# Patient Record
Sex: Male | Born: 1965 | Race: Black or African American | Hispanic: No | Marital: Married
Health system: Southern US, Community
[De-identification: ages and names within clinical notes are randomized; demographics above are authoritative.]

---

## 2008-07-14 ENCOUNTER — Emergency Department: Payer: Self-pay | Admitting: Emergency Medicine

## 2008-07-15 ENCOUNTER — Emergency Department: Payer: Self-pay

## 2008-07-26 ENCOUNTER — Ambulatory Visit: Payer: Self-pay | Admitting: General Surgery

## 2008-08-02 ENCOUNTER — Ambulatory Visit: Payer: Self-pay | Admitting: General Surgery

## 2008-08-03 ENCOUNTER — Ambulatory Visit: Payer: Self-pay | Admitting: General Surgery

## 2011-03-01 ENCOUNTER — Emergency Department: Payer: Self-pay | Admitting: Unknown Physician Specialty

## 2012-04-08 ENCOUNTER — Emergency Department: Payer: Self-pay | Admitting: Emergency Medicine

## 2012-04-08 LAB — BASIC METABOLIC PANEL
Anion Gap: 6 — ABNORMAL LOW (ref 7–16)
BUN: 15 mg/dL (ref 7–18)
Calcium, Total: 8.8 mg/dL (ref 8.5–10.1)
Co2: 31 mmol/L (ref 21–32)
EGFR (Non-African Amer.): >60
Glucose: 121 mg/dL — ABNORMAL HIGH (ref 65–99)
Osmolality: 285 (ref 275–301)
Potassium: 4 mmol/L (ref 3.5–5.1)

## 2012-04-08 LAB — TSH: Thyroid Stimulating Horm: 4.94 u[IU]/mL — ABNORMAL HIGH

## 2012-04-08 LAB — CK TOTAL AND CKMB (NOT AT ARMC)
CK, Total: 140 U/L (ref 35–232)
CK, Total: 135 U/L (ref 35–232)
CK-MB: <0.5 ng/mL — ABNORMAL LOW (ref 0.5–3.6)
CK-MB: 0.6 ng/mL (ref 0.5–3.6)

## 2012-04-08 LAB — CBC
HCT: 43.2 % (ref 40.0–52.0)
Platelet: 237 10*3/uL (ref 150–440)
RBC: 4.7 10*6/uL (ref 4.40–5.90)
RDW: 13.8 % (ref 11.5–14.5)
WBC: 6.7 10*3/uL (ref 3.8–10.6)

## 2013-12-25 ENCOUNTER — Emergency Department: Payer: Self-pay | Admitting: Emergency Medicine

## 2013-12-27 ENCOUNTER — Observation Stay: Payer: Self-pay | Admitting: Specialist

## 2013-12-27 LAB — BASIC METABOLIC PANEL
Anion Gap: 6 — ABNORMAL LOW (ref 7–16)
BUN: 21 mg/dL — AB (ref 7–18)
CHLORIDE: 98 mmol/L (ref 98–107)
CO2: 28 mmol/L (ref 21–32)
CREATININE: 1.17 mg/dL (ref 0.60–1.30)
Calcium, Total: 9.1 mg/dL (ref 8.5–10.1)
EGFR (African American): >60
GLUCOSE: 95 mg/dL (ref 65–99)
OSMOLALITY: 267 (ref 275–301)
Potassium: 4 mmol/L (ref 3.5–5.1)
Sodium: 132 mmol/L — ABNORMAL LOW (ref 136–145)

## 2013-12-27 LAB — CBC WITH DIFFERENTIAL/PLATELET
BASOS ABS: 0.1 10*3/uL (ref 0.0–0.1)
BASOS PCT: 0.6 %
EOS ABS: 0.2 10*3/uL (ref 0.0–0.7)
Eosinophil %: 2 %
HCT: 48 % (ref 40.0–52.0)
HGB: 16 g/dL (ref 13.0–18.0)
LYMPHS ABS: 2 10*3/uL (ref 1.0–3.6)
LYMPHS PCT: 20.3 %
MCH: 30.2 pg (ref 26.0–34.0)
MCHC: 33.3 g/dL (ref 32.0–36.0)
MCV: 91 fL (ref 80–100)
MONO ABS: 1.2 x10 3/mm — AB (ref 0.2–1.0)
MONOS PCT: 11.8 %
Neutrophil #: 6.6 10*3/uL — ABNORMAL HIGH (ref 1.4–6.5)
Neutrophil %: 65.3 %
Platelet: 237 10*3/uL (ref 150–440)
RBC: 5.29 10*6/uL (ref 4.40–5.90)
RDW: 14 % (ref 11.5–14.5)
WBC: 10.1 10*3/uL (ref 3.8–10.6)

## 2013-12-28 LAB — BASIC METABOLIC PANEL
Anion Gap: 7 (ref 7–16)
BUN: 15 mg/dL (ref 7–18)
Calcium, Total: 8.8 mg/dL (ref 8.5–10.1)
Chloride: 100 mmol/L (ref 98–107)
Co2: 29 mmol/L (ref 21–32)
Creatinine: 0.99 mg/dL (ref 0.60–1.30)
EGFR (African American): >60
EGFR (Non-African Amer.): >60
Glucose: 105 mg/dL — ABNORMAL HIGH (ref 65–99)
Osmolality: 273 (ref 275–301)
Potassium: 3.8 mmol/L (ref 3.5–5.1)
Sodium: 136 mmol/L (ref 136–145)

## 2013-12-28 LAB — CBC WITH DIFFERENTIAL/PLATELET
Basophil #: 0 10*3/uL (ref 0.0–0.1)
Basophil %: 0.5 %
EOS ABS: 0.2 10*3/uL (ref 0.0–0.7)
EOS PCT: 3.1 %
HCT: 43.4 % (ref 40.0–52.0)
HGB: 14.4 g/dL (ref 13.0–18.0)
Lymphocyte #: 2.5 10*3/uL (ref 1.0–3.6)
Lymphocyte %: 33.3 %
MCH: 30 pg (ref 26.0–34.0)
MCHC: 33.3 g/dL (ref 32.0–36.0)
MCV: 90 fL (ref 80–100)
Monocyte #: 1.1 x10 3/mm — ABNORMAL HIGH (ref 0.2–1.0)
Monocyte %: 15 %
Neutrophil #: 3.6 10*3/uL (ref 1.4–6.5)
Neutrophil %: 48.1 %
PLATELETS: 260 10*3/uL (ref 150–440)
RBC: 4.82 10*6/uL (ref 4.40–5.90)
RDW: 13.9 % (ref 11.5–14.5)
WBC: 7.4 10*3/uL (ref 3.8–10.6)

## 2014-01-01 LAB — CULTURE, BLOOD (SINGLE)

## 2014-01-16 ENCOUNTER — Ambulatory Visit: Payer: Self-pay

## 2014-06-06 IMAGING — CR DG CHEST 2V
1 series · 2 of 2 positions shown · non-contrast
Comparison: None.

CLINICAL DATA: Bi cough.  Decreased breath sounds in lung bases.

EXAM:
CHEST  2 VIEW

[Series 1: w chest pa · 0.14mm/px · 2 of 2 slices shown]
[im 1/2]
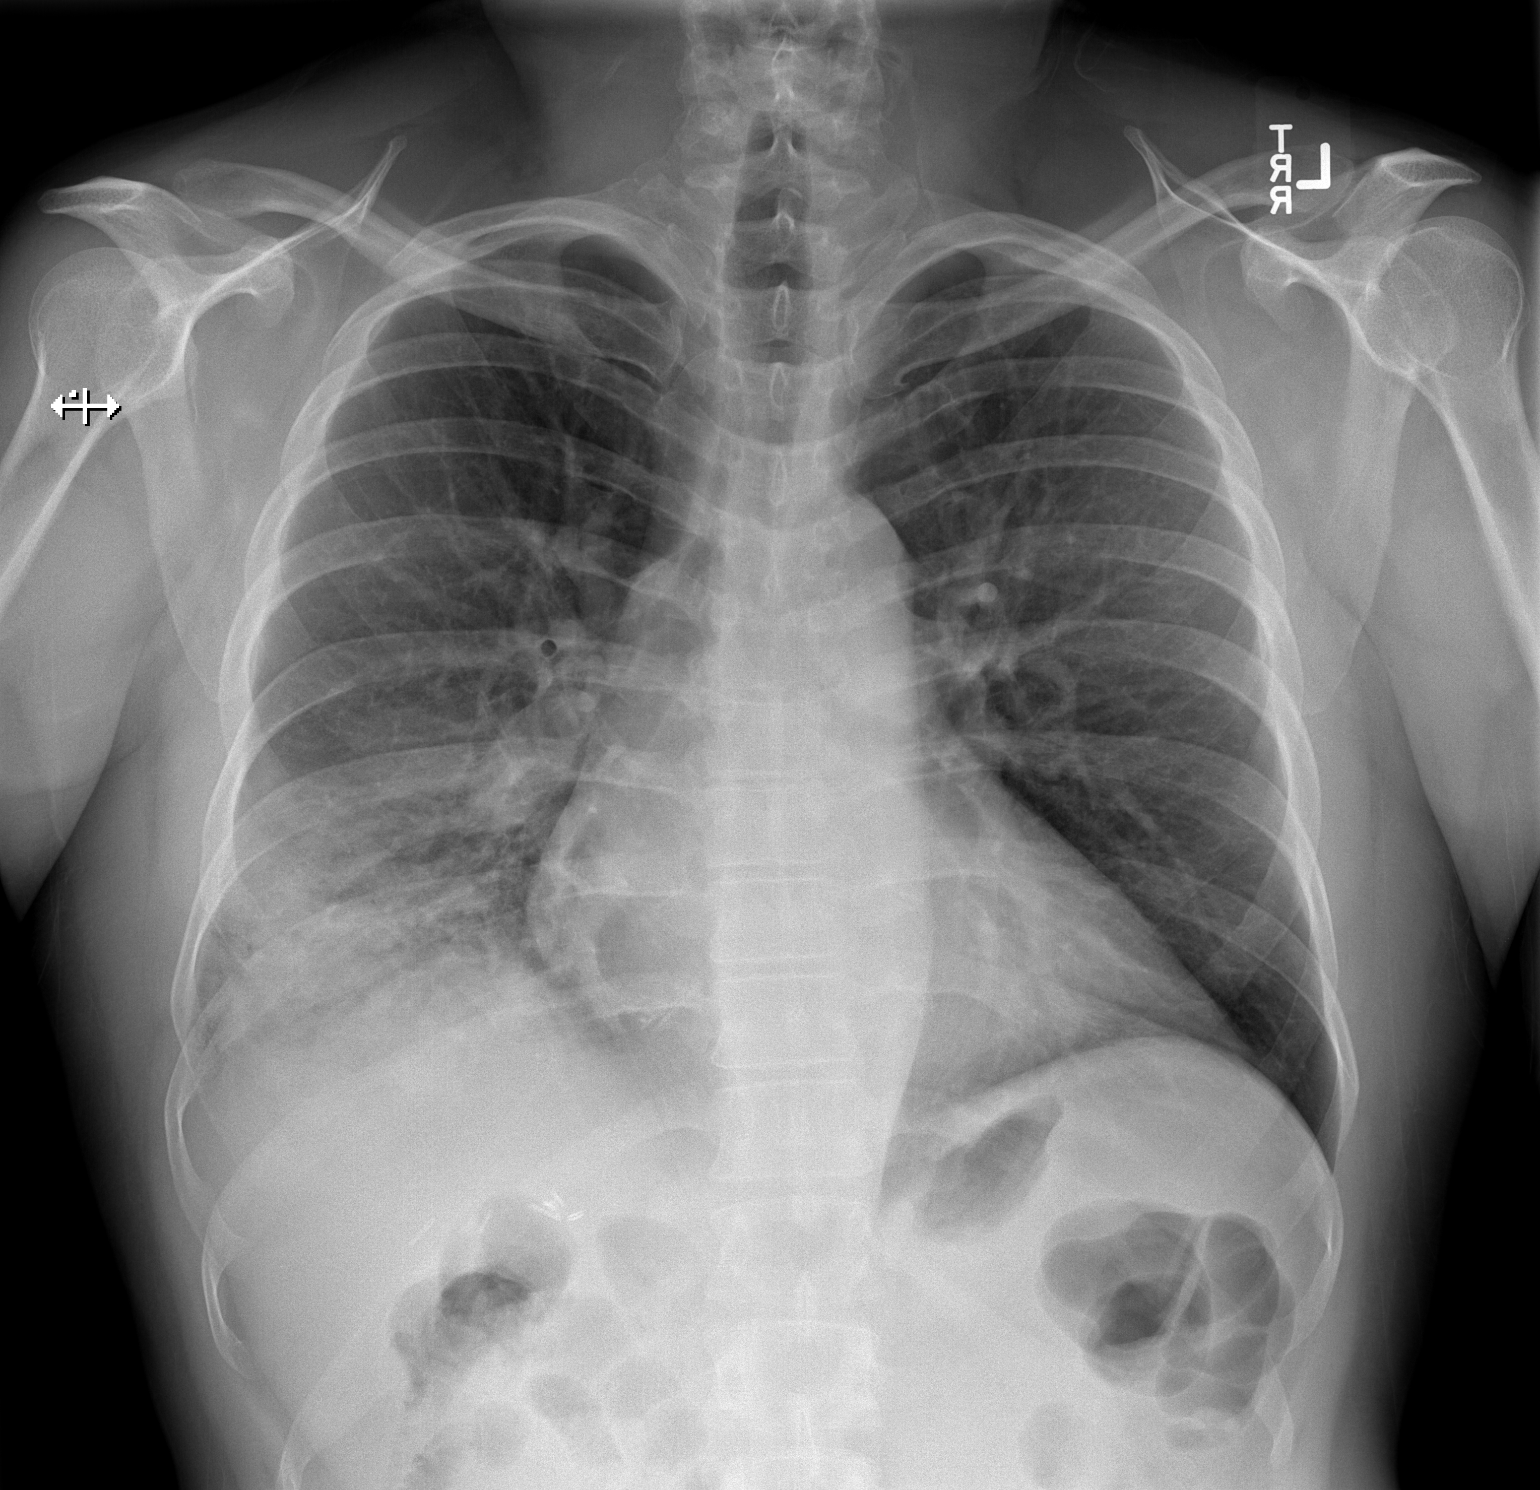
[im 2/2]
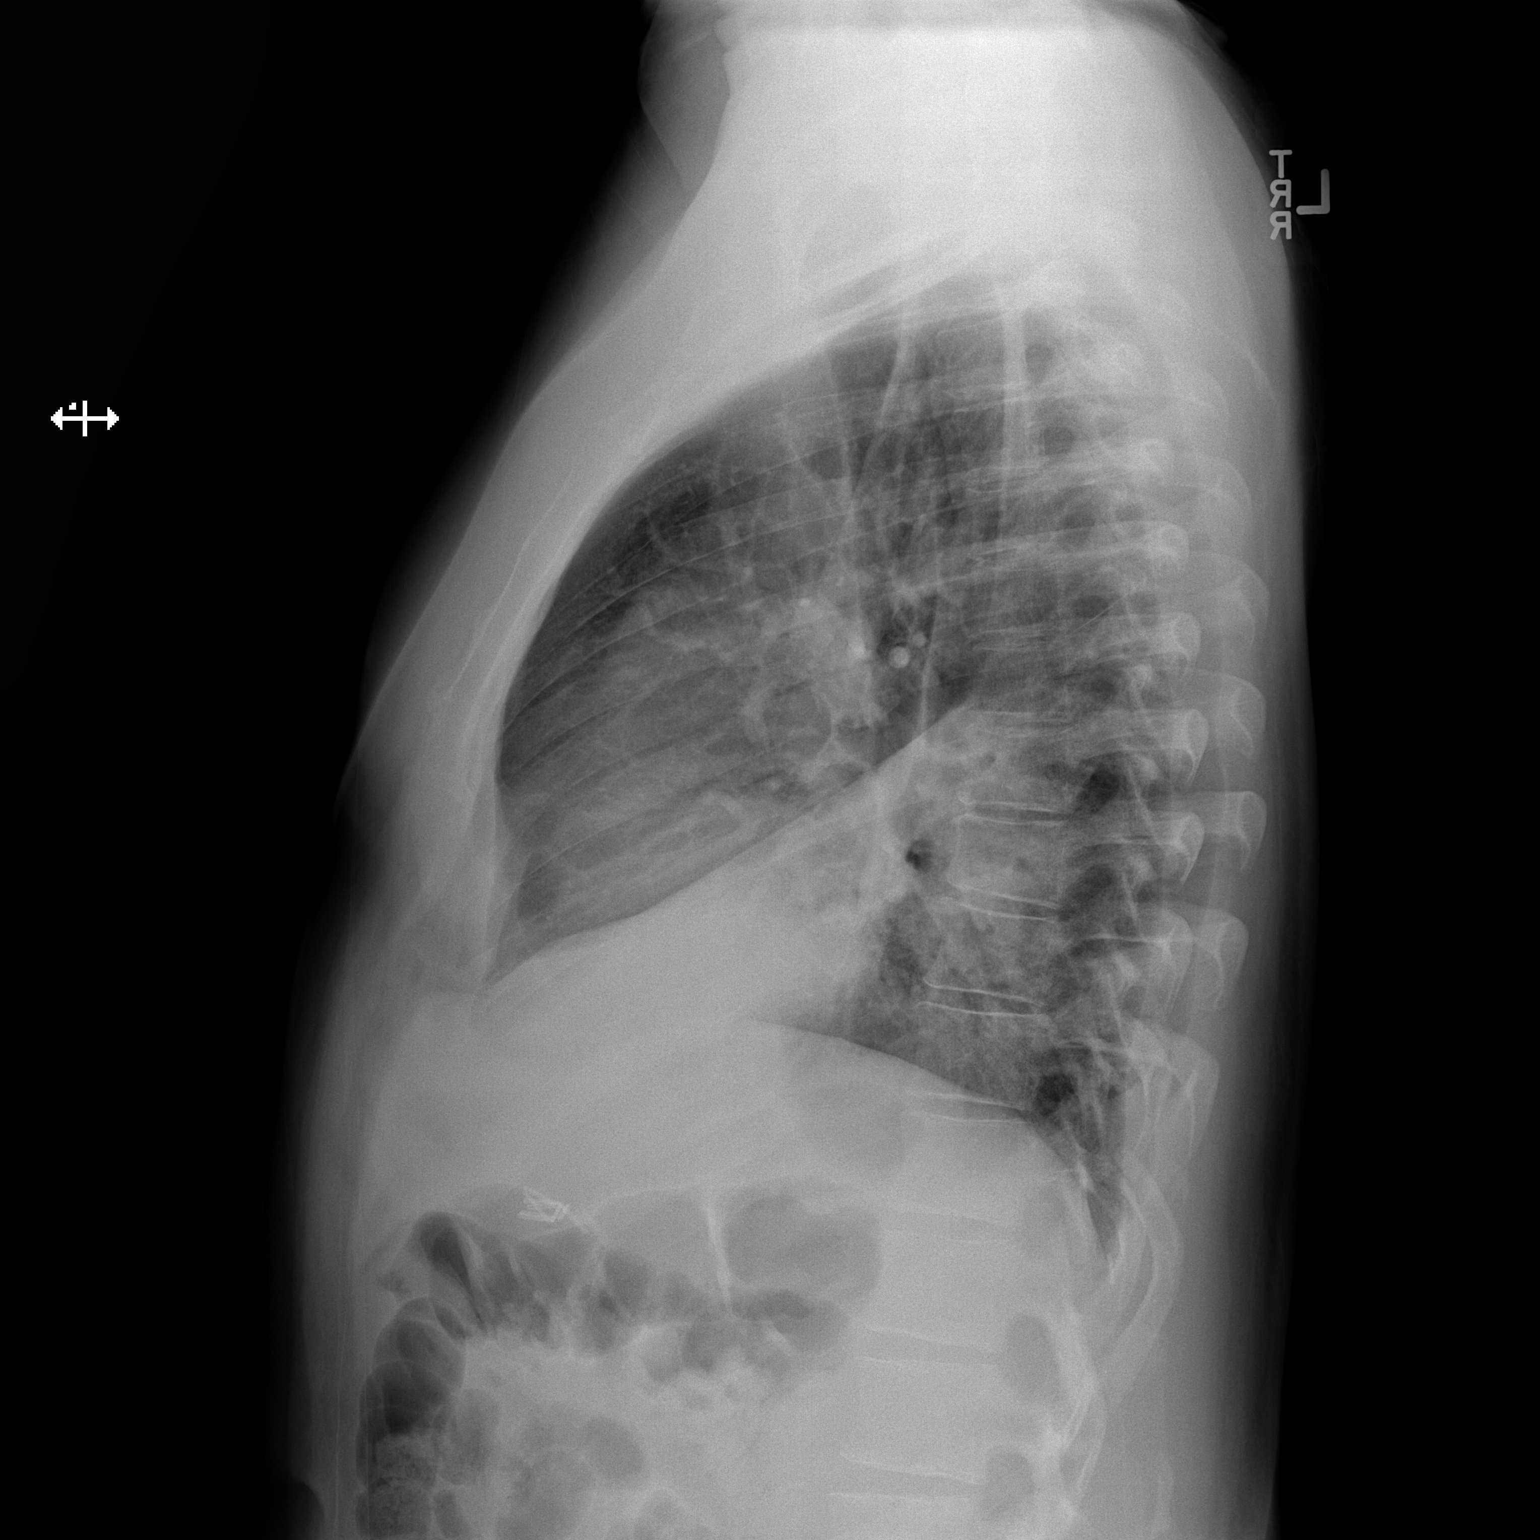

[2 of 2 positions shown; findings below may reference images not displayed]

FINDINGS: Airspace disease is seen involving a significant portion of the
right lower lobe, consistent with pneumonia. Left lung is clear. No
evidence of pleural effusion. No definite mass or lymphadenopathy
identified. Heart size is normal.
IMPRESSION: Right lower lobe airspace disease, consistent with pneumonia.
Recommend chest radiographic followup in several weeks to confirm
resolution.

## 2014-06-28 IMAGING — CR DG CHEST 2V
1 series · 3 of 3 positions shown · non-contrast
Comparison: 12/25/2013

CLINICAL DATA: Follow-up pneumonia

EXAM:
CHEST  2 VIEW

[Series 1: w chest pa · 0.14mm/px · 3 of 3 slices shown]
[im 1/3]
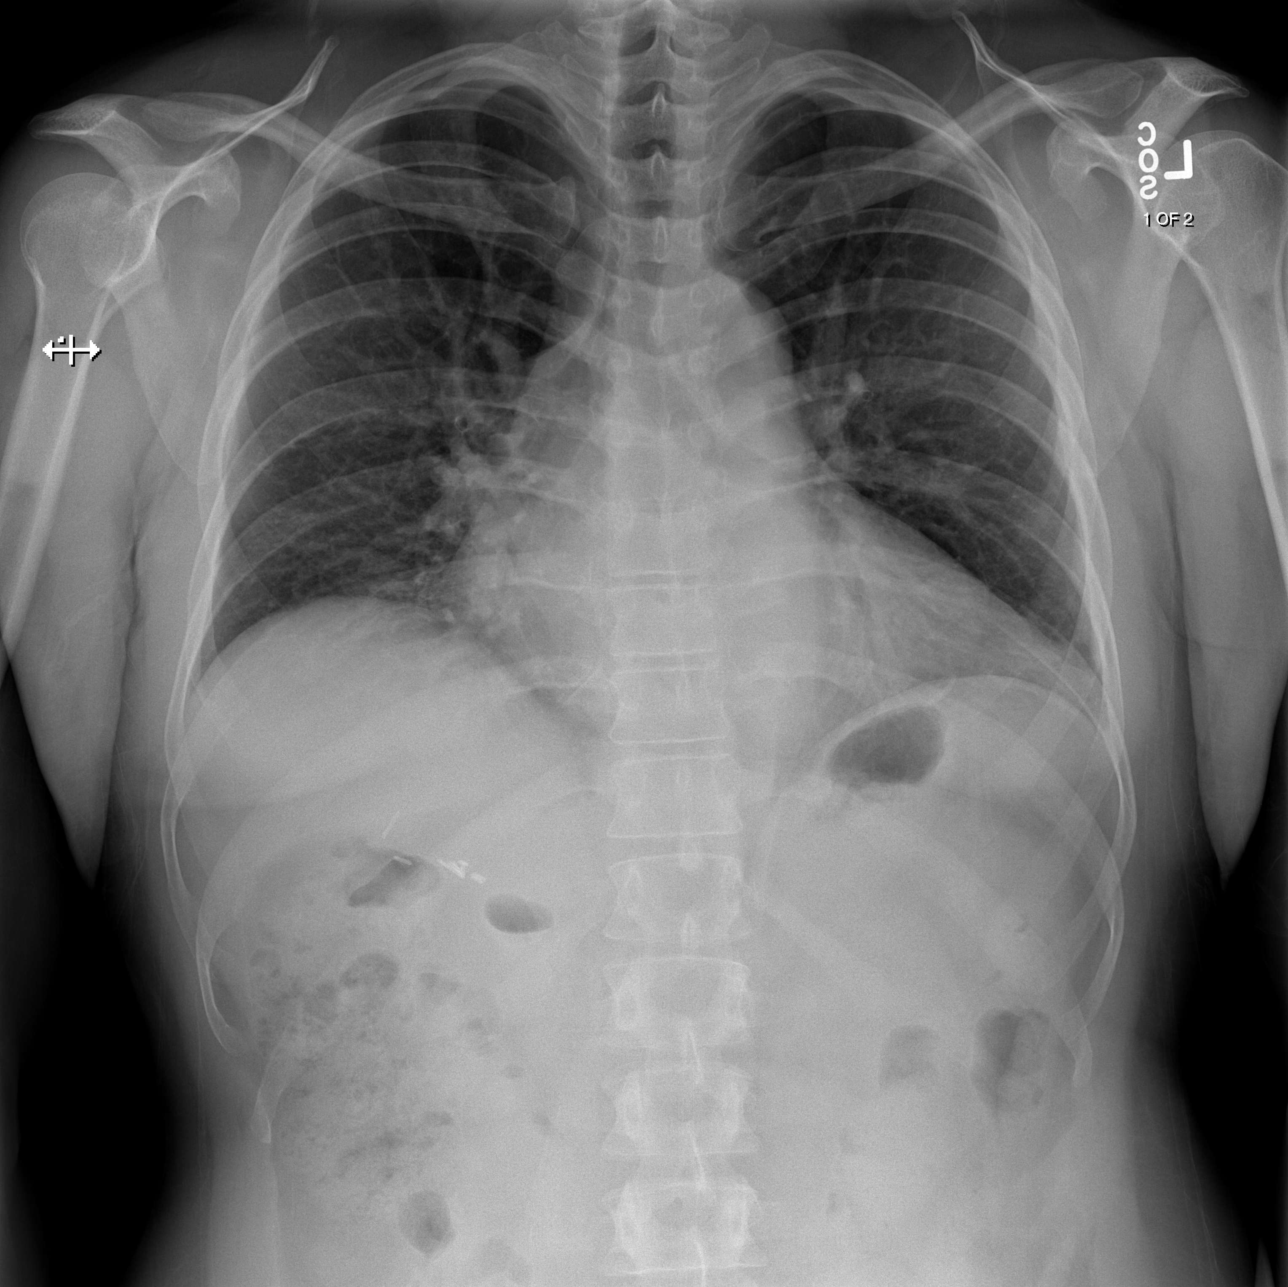
[im 2/3]
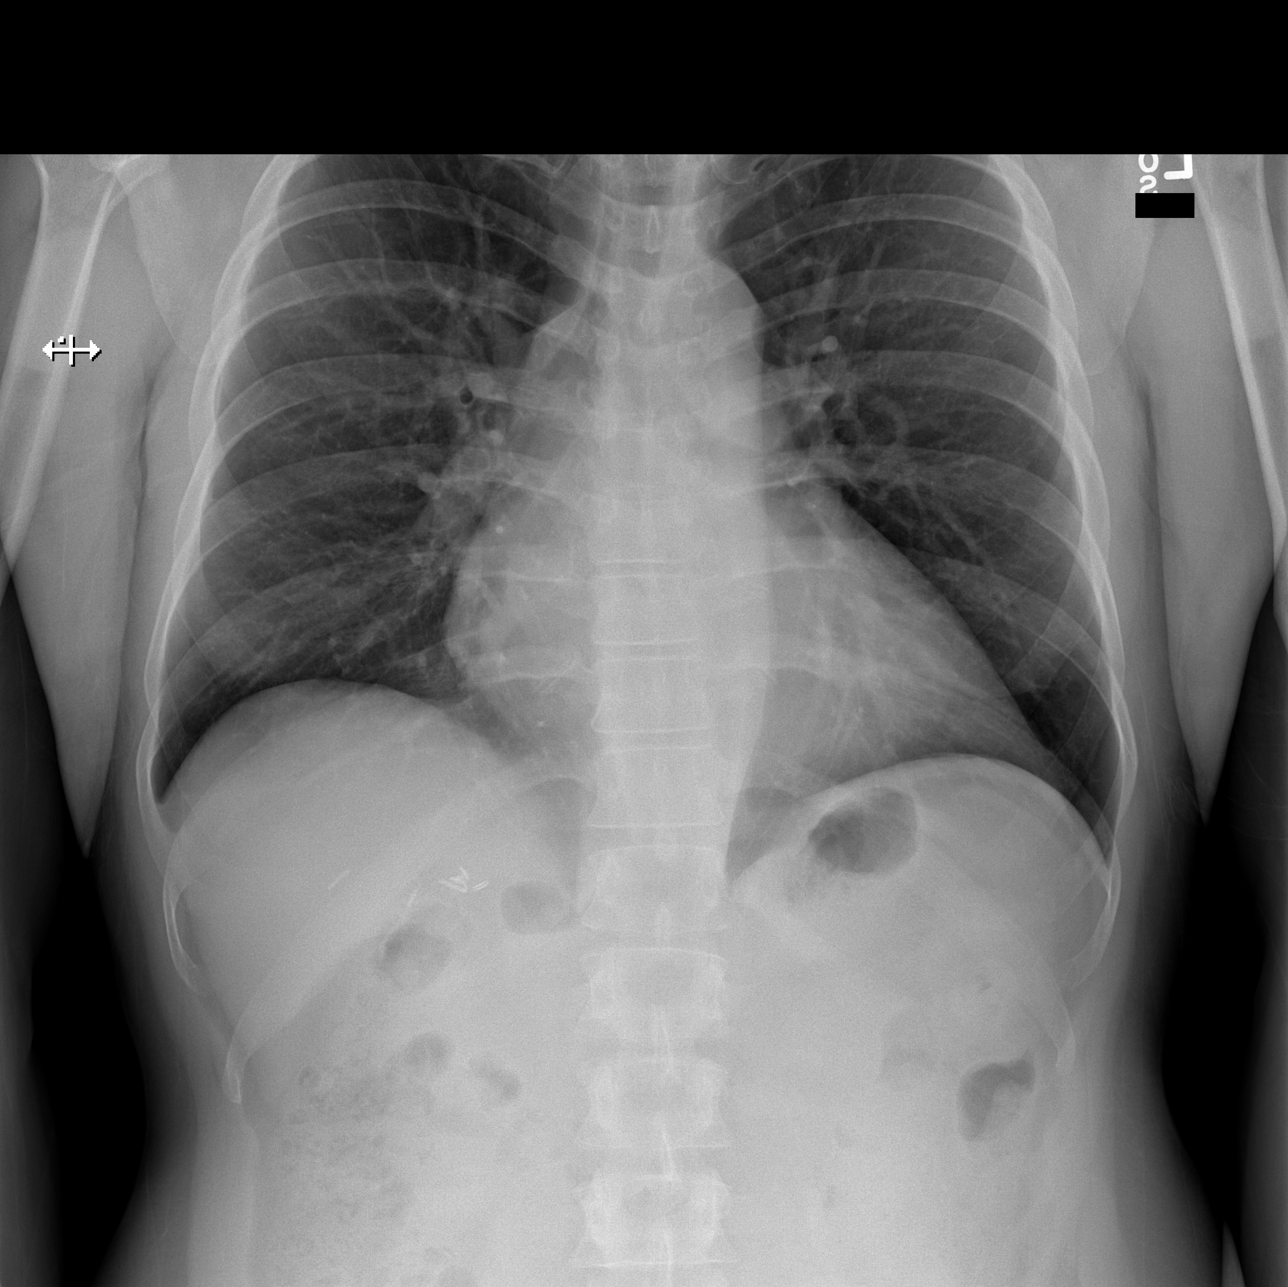
[im 3/3]
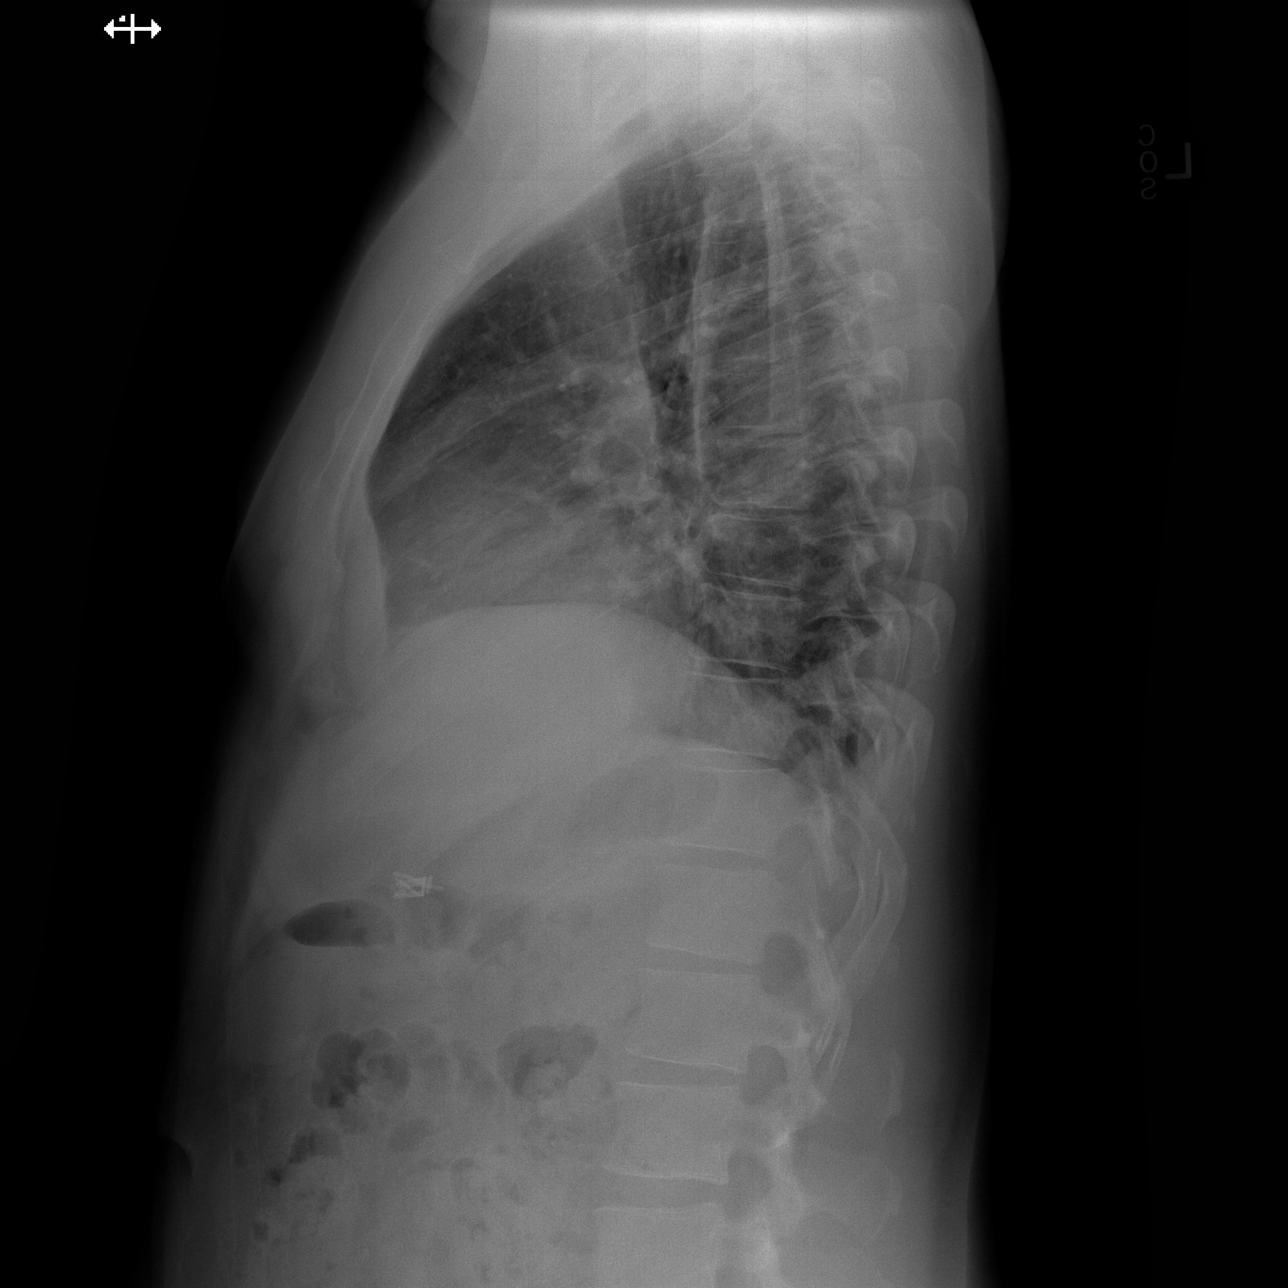

[3 of 3 positions shown; findings below may reference images not displayed]

FINDINGS: Lungs are clear. Prior right lower lobe opacity/pneumonia has
resolved. No pleural effusion or pneumothorax.

The heart is normal in size.

Visualized osseous structures are within normal limits.

Cholecystectomy clips.
IMPRESSION: No evidence of acute cardiopulmonary disease.

## 2014-12-11 NOTE — H&P (Signed)
PATIENT NAME:  Lance Young, Masyn D MR#:  [id number] DATE OF BIRTH:  1966/07/19  DATE OF ADMISSION:  12/27/2013  [place]   ED REFERRING DOCTOR:  Dr. Lenard LancePaduchowski  CHIEF COMPLAINT: Cough, shortness of breath.   HISTORY OF PRESENT ILLNESS: The patient is a 49 year old, African-American male with a history of hypertension and sleep apnea, who states that he started feeling bad last week. With cough and shortness of breath, he was seen at [facility] and told that he had flu-like symptoms, and was started on Tamiflu. He continued to have cough and shortness of breath, so he came back to the ED here on May 8, was noted to have right lower lobe airspace disease consistent with pneumonia. He was discharged on azithromycin; however, he continued to complain of cough and congestion. Therefore, he came back to the ER. We are asked to admit the patient. He reports that he is continuing to have fever and a temperature of 102. He has been coughing, but it is a nonproductive cough. He feels that he has congestion, but it is not coming out. He also has body aches and chills. Denies any chest pain or palpitations. Denies any nausea, vomiting or diarrhea. Has also been having intermittent wheezing. Denies any abdominal pain.   PAST MEDICAL HISTORY: History of hypertension, sleep apnea, supposed to be on CPAP but there is something wrong with his tubing, so he is getting plan to change it out.   PAST SURGICAL HISTORY: Status post cholecystectomy, status post splenectomy.   ALLERGIES: None.   MEDICATIONS: Propranolol 60, 1 tab p.o. b.i.d., melatonin as needed, azithromycin 5 day course.   SOCIAL HISTORY: Does not smoke. Does not drink. No drugs.   FAMILY HISTORY: Positive for hypertension on his Dad's side.   REVIEW OF SYSTEMS:   CONSTITUTIONAL: Complains of fevers. No pain. No weight loss. No weight gain.  EYES: No blurred or double vision. No pain. No redness. No inflammation. No  glaucoma.  ENT: No tinnitus. No ear pain. No hearing loss. No seasonal or year-round allergies. No epistaxis. Complains of coughing. Denies any difficulty swallowing.  RESPIRATORY: Complains of coughing. No wheezing. No hemoptysis. No COPD.  CARDIOVASCULAR: Denies any chest pain, orthopnea, edema, or arrhythmia.  GASTROINTESTINAL: No nausea, vomiting, diarrhea. No abdominal pain. No hematemesis. No melena.  GENITOURINARY: Denies any dysuria, hematuria, renal calculus or frequency.  ENDOCRINE: Denies any polyuria, nocturia or thyroid problems.  HEMATOLOGIC/LYMPHATIC: Denies anemia, easy bruisability or bleeding.  SKIN: No acne. No rash. No changes in mole, hair or skin.  MUSCULOSKELETAL: No pain in neck, back or shoulder.  NEUROLOGIC: No numbness, CVA, TIA, or seizures.  PSYCHIATRIC: No anxiety, insomnia, or ADD.   PHYSICAL EXAMINATION: VITAL SIGNS: Temperature 99.8, pulse 85, respirations 18, blood pressure 144/87, O2 is 97%.  GENERAL: The patient is a well-developed male in no acute distress.  HEENT: Head atraumatic, normocephalic. Pupils equally round, reactive to light and accommodation. There is no conjunctival pallor. No scleral icterus. Nasal exam shows no drainage or ulceration. Oropharynx is clear, without any exudate.  NECK: Supple, without any JVD.  CARDIOVASCULAR: Regular rate and rhythm. No murmurs, rubs, clicks, or gallops.  LUNGS: He has rhonchi in the right lung bases. No accessory muscle usage.  ABDOMEN: Soft, nontender, nondistended. Positive bowel sounds x 4.  EXTREMITIES: No clubbing, cyanosis, edema.  SKIN: No rash.  LYMPHATICS: No lymph nodes palpable.  VASCULAR: Good DP, PT pulses.  PSYCHIATRIC: Not anxious or depressed.  NEUROLOGIC: Awake,  alert, oriented x 3. No focal deficits.   DATA:  Labs currently pending. Chest x-ray from May 8 showed right lower lobe airspace disease.   ASSESSMENT AND PLAN: The patient is a 49 year old, African-American male with history of  pneumonia, failed outpatient therapy.  1.  Shortness of breath and cough due to community-acquired pneumonia. At this time, will treat him with IV Levaquin, place him on Mucinex. Will obtain sputum cultures for the p.r.n. nebulizers.  2.  Hypertension. Will continue propranolol as taking at home.  3.  Sleep apnea. The patient will need to have his sleep machine issue resolved soon to be restarted on the CPAP.  4.  Miscellaneous: Will do Lovenox for deep vein thrombosis prophylaxis.  Time spent on this patient:  50 minutes.    ____________________________ Lacie Scotts Allena Katz, MD shp:mr D: 12/27/2013 13:52:00 ET T: 12/27/2013 16:00:47 ET JOB#: [id number]  cc: Kayzlee Wirtanen H. Allena Katz, MD, <Dictator> Charise Carwin MD ELECTRONICALLY SIGNED 12/29/2013 14:44

## 2014-12-11 NOTE — Discharge Summary (Signed)
PATIENT NAME:  Lance Young, Lance Young MR#:  [id number] DATE OF BIRTH:  November 11, 1965  DATE OF ADMISSION:  12/27/2013 DATE OF DISCHARGE:  12/28/2013  For a detailed note, please take a look at the history and physical done at admission by Dr. Auburn BilberryShreyang Patel.   DIAGNOSES AT DISCHARGE: As follows: Community-acquired pneumonia.  Hypertension.   DISCHARGE DIET:  The patient is being discharged on a low-sodium diet.   ACTIVITY: As tolerated.   FOLLOWUP: With his primary care physician in the next 1-2 weeks. (Dictation Anomaly)  DISCHARGE MEDICATIONS: Propanolol 60 mg b.i.Young., Mucinex 600 mg b.i.Young., Levaquin 750 mg daily x 7 days.   PERTINENT STUDIES DONE DURING THE HOSPITAL COURSE: Are as follows: Blood cultures noted to be negative. A chest x-ray done on 12/25/2013 showing right lower lobe air space disease consistent with pneumonia.   BRIEF HOSPITAL COURSE: This is a 49 year old male with medical problems as mentioned above, presented to the hospital on 12/27/2013 due to shortness of breath and cough and noted to have a right lower lobe pneumonia having failed outpatient therapy.  1. Right lower lobe pneumonia. This was likely the cause of the patient's shortness of breath and cough. The patient apparently had failed outpatient therapy with oral Zithromax which is not adequate treatment for underlying pneumonia. He was admitted to hospital, started on IV Levaquin. He still continues to have a cough which is nonproductive and therefore is being discharged on oral Mucinex. He has been afebrile in the past 24 hours. His blood cultures are negative. His white cell count is normal. He was ambulated on room air. Did not desaturate; therefore, he is being discharged on a 7-day course for Levaquin for the treatment of his pneumonia.  2. Hypertension. The patient remained hemodynamically stable. He will continue his propanolol upon discharge.   CODE STATUS: The patient is a full code.   TIME SPENT: 40  minutes.    ____________________________ Rolly PancakeVivek J. Cherlynn KaiserSainani, MD vjs:dd Young: 12/28/2013 16:00:00 ET T: 12/29/2013 03:01:31 ET JOB#: [id number]  cc: Rolly PancakeVivek J. Cherlynn KaiserSainani, MD, <Dictator> Lance SirenVIVEK J Latiesha Harada MD ELECTRONICALLY SIGNED 12/30/2013 14:06

## 2016-05-22 ENCOUNTER — Ambulatory Visit: Payer: 59 | Attending: Internal Medicine

## 2016-05-22 DIAGNOSIS — M542 Cervicalgia: Secondary | ICD-10-CM
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
## 2016-05-22 NOTE — Patient Instructions (Signed)
[email or web address] Bil cervical rotation with towel Proper sitting with lumbar roll bil scap retraction with BTB

## 2016-05-24 ENCOUNTER — Ambulatory Visit: Payer: 59 | Admitting: Physical Therapy

## 2016-05-28 ENCOUNTER — Encounter: Payer: Self-pay | Admitting: Physical Therapy

## 2016-05-30 ENCOUNTER — Ambulatory Visit: Payer: 59 | Admitting: Physical Therapy

## 2016-05-30 ENCOUNTER — Encounter: Payer: Self-pay | Admitting: Physical Therapy

## 2016-05-30 DIAGNOSIS — M542 Cervicalgia: Secondary | ICD-10-CM | POA: Diagnosis not present
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
## 2016-05-30 NOTE — Patient Instructions (Signed)
[email or web address]  Self UT stretch bil low rows with BTB

## 2016-05-31 ENCOUNTER — Encounter: Payer: Self-pay | Admitting: Physical Therapy

## 2016-06-06 ENCOUNTER — Ambulatory Visit: Payer: 59 | Admitting: Physical Therapy

## 2016-06-06 DIAGNOSIS — M542 Cervicalgia: Secondary | ICD-10-CM

## 2016-06-06 NOTE — Patient Instructions (Signed)
[email or web address]  bil scap retraction and ER with BTB Band pulls with BTB
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
## 2016-06-20 ENCOUNTER — Ambulatory Visit: Payer: 59 | Attending: Internal Medicine | Admitting: Physical Therapy

## 2016-06-20 DIAGNOSIS — M542 Cervicalgia: Secondary | ICD-10-CM | POA: Diagnosis present
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
## 2016-06-27 ENCOUNTER — Ambulatory Visit: Payer: 59 | Admitting: Physical Therapy

## 2016-06-27 DIAGNOSIS — M542 Cervicalgia: Secondary | ICD-10-CM
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
## 2016-07-04 ENCOUNTER — Ambulatory Visit: Payer: 59 | Admitting: Physical Therapy

## 2016-07-04 DIAGNOSIS — M542 Cervicalgia: Secondary | ICD-10-CM
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
## 2016-07-11 ENCOUNTER — Encounter: Payer: 59 | Admitting: Physical Therapy

## 2016-07-26 ENCOUNTER — Ambulatory Visit: Payer: 59 | Attending: Internal Medicine | Admitting: Physical Therapy

## 2016-07-26 DIAGNOSIS — M542 Cervicalgia: Secondary | ICD-10-CM | POA: Insufficient documentation
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
## 2022-01-23 ENCOUNTER — Ambulatory Visit: Payer: BC Managed Care – PPO | Attending: Internal Medicine | Admitting: Physical Therapy

## 2022-01-23 ENCOUNTER — Encounter (INDEPENDENT_AMBULATORY_CARE_PROVIDER_SITE_OTHER): Payer: Self-pay

## 2022-01-23 ENCOUNTER — Encounter: Payer: Self-pay | Admitting: Physical Therapy

## 2022-01-23 ENCOUNTER — Other Ambulatory Visit: Payer: Self-pay

## 2022-01-23 DIAGNOSIS — M5416 Radiculopathy, lumbar region: Secondary | ICD-10-CM | POA: Diagnosis present

## 2022-01-23 DIAGNOSIS — M62830 Muscle spasm of back: Secondary | ICD-10-CM | POA: Insufficient documentation
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
## 2022-01-23 NOTE — Patient Instructions (Signed)
Access Code: [email or web address] URL: [email or web address] Date: 01/23/2022 Prepared by: Almyra Free  Exercises - Supine Hamstring Stretch with Strap  - 2 x daily - 7 x weekly - 1 sets - 3 reps - 30-60 sec hold - Seated Sciatic Tensioner  - 2 x daily - 7 x weekly - 1 sets - 10 reps - 5 second hold - Prone Press Up  - 3-4 x daily - 7 x weekly - 1-3 sets - 10 reps - Supine Bridge  - 2 x daily - 7 x weekly - 1-3 sets - 10 reps  Patient Education - Biomedical scientist

## 2022-01-26 ENCOUNTER — Ambulatory Visit: Payer: BC Managed Care – PPO | Admitting: Physical Therapy

## 2022-01-26 DIAGNOSIS — M5416 Radiculopathy, lumbar region: Secondary | ICD-10-CM

## 2022-01-26 DIAGNOSIS — M62830 Muscle spasm of back: Secondary | ICD-10-CM
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
## 2022-01-30 ENCOUNTER — Ambulatory Visit: Payer: BC Managed Care – PPO | Admitting: Physical Therapy

## 2022-01-30 DIAGNOSIS — M62830 Muscle spasm of back: Secondary | ICD-10-CM

## 2022-01-30 DIAGNOSIS — M5416 Radiculopathy, lumbar region: Secondary | ICD-10-CM
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
## 2022-02-01 ENCOUNTER — Encounter: Payer: Self-pay | Admitting: Physical Therapy

## 2022-02-01 ENCOUNTER — Ambulatory Visit: Payer: BC Managed Care – PPO | Admitting: Physical Therapy

## 2022-02-01 DIAGNOSIS — M62830 Muscle spasm of back: Secondary | ICD-10-CM

## 2022-02-01 DIAGNOSIS — M5416 Radiculopathy, lumbar region: Secondary | ICD-10-CM | POA: Diagnosis not present
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
## 2022-02-05 ENCOUNTER — Ambulatory Visit: Payer: BC Managed Care – PPO | Admitting: Physical Therapy

## 2022-02-05 DIAGNOSIS — M62830 Muscle spasm of back: Secondary | ICD-10-CM

## 2022-02-05 DIAGNOSIS — M5416 Radiculopathy, lumbar region: Secondary | ICD-10-CM | POA: Diagnosis not present
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
## 2022-02-08 ENCOUNTER — Ambulatory Visit: Payer: BC Managed Care – PPO | Admitting: Physical Therapy

## 2022-02-08 ENCOUNTER — Encounter: Payer: Self-pay | Admitting: Physical Therapy

## 2022-02-08 DIAGNOSIS — M5416 Radiculopathy, lumbar region: Secondary | ICD-10-CM

## 2022-02-08 DIAGNOSIS — M62830 Muscle spasm of back: Secondary | ICD-10-CM
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
## 2022-02-12 ENCOUNTER — Ambulatory Visit: Payer: BC Managed Care – PPO | Admitting: Rehabilitative and Restorative Service Providers"

## 2022-02-12 ENCOUNTER — Encounter: Payer: Self-pay | Admitting: Rehabilitative and Restorative Service Providers"

## 2022-02-12 DIAGNOSIS — M5416 Radiculopathy, lumbar region: Secondary | ICD-10-CM

## 2022-02-12 DIAGNOSIS — M62830 Muscle spasm of back: Secondary | ICD-10-CM

## 2022-02-15 ENCOUNTER — Ambulatory Visit: Payer: BC Managed Care – PPO | Admitting: Physical Therapy

## 2022-02-15 ENCOUNTER — Encounter: Payer: Self-pay | Admitting: Physical Therapy

## 2022-02-15 DIAGNOSIS — M5416 Radiculopathy, lumbar region: Secondary | ICD-10-CM | POA: Diagnosis not present

## 2022-02-15 DIAGNOSIS — M62830 Muscle spasm of back: Secondary | ICD-10-CM

## 2022-02-15 NOTE — Patient Instructions (Signed)
Access Code: [email or web address] URL: [email or web address] Date: 02/15/2022 Prepared by: Raynelle Fanning  Exercises - Supine Hamstring Stretch with Strap  - 2 x daily - 7 x weekly - 1 sets - 3 reps - 30-60 sec hold - Seated Sciatic Tensioner  - 2 x daily - 7 x weekly - 1 sets - 10 reps - 5 second hold - Prone Press Up  - 3-4 x daily - 7 x weekly - 1-3 sets - 10 reps - Prone Hip Extension  - 1 x daily - 7 x weekly - 2 sets - 10 reps - Seated Piriformis Stretch  - 1 x daily - 7 x weekly - 2 sets - 30 sec hold - Seated Figure 4 Piriformis Stretch  - 1 x daily - 7 x weekly - 2 sets - 30 sec hold - Seated Hip Flexor Stretch  - 1 x daily - 7 x weekly - 2 sets - 30 sec hold - Bilateral Bent Leg Lift  - 1 x daily - 7 x weekly - 2 sets - 10 reps - Sidelying Reverse Clamshell  - 1 x daily - 7 x weekly - 2 sets - 10 reps - Clamshell  - 1 x daily - 7 x weekly - 2 sets - 10 reps - Marching Bridge  - 1 x daily - 7 x weekly - 2 sets - 10 reps - Supine Transversus Abdominis Bracing with Pelvic Floor Contraction  - 2 x daily - 7 x weekly - 1 sets - 10 reps - 10sec  hold - Hooklying Isometric Clamshell  - 2 x daily - 7 x weekly - 1 sets - 10 reps - 3 sec  hold - Standard Plank  - 2 x daily - 3 x weekly - 1 sets - 5 reps - max hold - Supine Transversus Abdominis Bracing with Leg Extension  - 1 x daily - 3 x weekly - 1-3 sets - 10 reps
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
## 2022-02-22 ENCOUNTER — Encounter: Payer: BC Managed Care – PPO | Admitting: Physical Therapy

## 2022-03-01 ENCOUNTER — Ambulatory Visit: Payer: BC Managed Care – PPO | Attending: Internal Medicine | Admitting: Physical Therapy

## 2022-03-01 DIAGNOSIS — M5416 Radiculopathy, lumbar region: Secondary | ICD-10-CM | POA: Insufficient documentation

## 2022-03-01 DIAGNOSIS — M62830 Muscle spasm of back: Secondary | ICD-10-CM | POA: Insufficient documentation
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
